# Patient Record
Sex: Female | Born: 1980 | Race: White | Hispanic: No | Marital: Married | Smoking: Former smoker
Health system: Southern US, Community
[De-identification: ages and names within clinical notes are randomized; demographics above are authoritative.]

## PROBLEM LIST (undated history)

## (undated) HISTORY — PX: CHOLECYSTECTOMY: SHX55

---

## 1998-08-29 ENCOUNTER — Other Ambulatory Visit: Admission: RE | Admit: 1998-08-29 | Discharge: 1998-08-29 | Payer: Self-pay | Admitting: *Deleted

## 1999-09-08 ENCOUNTER — Other Ambulatory Visit: Admission: RE | Admit: 1999-09-08 | Discharge: 1999-09-08 | Payer: Self-pay | Admitting: *Deleted

## 2000-10-19 ENCOUNTER — Other Ambulatory Visit: Admission: RE | Admit: 2000-10-19 | Discharge: 2000-10-19 | Payer: Self-pay | Admitting: Internal Medicine

## 2001-05-06 ENCOUNTER — Observation Stay (HOSPITAL_COMMUNITY): Admission: RE | Admit: 2001-05-06 | Discharge: 2001-05-06 | Payer: Self-pay

## 2001-05-06 ENCOUNTER — Encounter (INDEPENDENT_AMBULATORY_CARE_PROVIDER_SITE_OTHER): Payer: Self-pay | Admitting: Specialist

## 2001-05-09 ENCOUNTER — Inpatient Hospital Stay (HOSPITAL_COMMUNITY): Admission: RE | Admit: 2001-05-09 | Discharge: 2001-05-12 | Payer: Self-pay

## 2001-05-10 ENCOUNTER — Encounter: Payer: Self-pay | Admitting: Gastroenterology

## 2001-05-11 ENCOUNTER — Encounter: Payer: Self-pay | Admitting: General Surgery

## 2001-05-26 ENCOUNTER — Encounter: Payer: Self-pay | Admitting: Gastroenterology

## 2001-05-26 ENCOUNTER — Ambulatory Visit (HOSPITAL_COMMUNITY): Admission: RE | Admit: 2001-05-26 | Discharge: 2001-05-26 | Payer: Self-pay | Admitting: Gastroenterology

## 2001-06-21 ENCOUNTER — Ambulatory Visit (HOSPITAL_COMMUNITY): Admission: RE | Admit: 2001-06-21 | Discharge: 2001-06-21 | Payer: Self-pay | Admitting: Gastroenterology

## 2003-01-03 ENCOUNTER — Other Ambulatory Visit: Admission: RE | Admit: 2003-01-03 | Discharge: 2003-01-03 | Payer: Self-pay | Admitting: Internal Medicine

## 2003-11-26 ENCOUNTER — Other Ambulatory Visit: Admission: RE | Admit: 2003-11-26 | Discharge: 2003-11-26 | Payer: Self-pay | Admitting: Internal Medicine

## 2004-03-05 ENCOUNTER — Other Ambulatory Visit: Admission: RE | Admit: 2004-03-05 | Discharge: 2004-03-05 | Payer: Self-pay | Admitting: Internal Medicine

## 2005-03-09 ENCOUNTER — Other Ambulatory Visit: Admission: RE | Admit: 2005-03-09 | Discharge: 2005-03-09 | Payer: Self-pay | Admitting: Internal Medicine

## 2006-03-30 ENCOUNTER — Other Ambulatory Visit: Admission: RE | Admit: 2006-03-30 | Discharge: 2006-03-30 | Payer: Self-pay | Admitting: Internal Medicine

## 2008-04-24 ENCOUNTER — Other Ambulatory Visit: Admission: RE | Admit: 2008-04-24 | Discharge: 2008-04-24 | Payer: Self-pay | Admitting: Internal Medicine

## 2010-07-04 NOTE — Op Note (Signed)
[facility]  Patient:    Christina Greer, Christina Greer Visit Number: [id number] MRN: [id number]          Service Type: SUR Location: [id number] [phone] Attending Physician:  Meredith Leeds Dictated by:   Zigmund Daniel, M.D. Proc. Date: 05/06/01 Admit Date:  05/06/2001 Discharge Date: 05/06/2001                             Operative Report  PREOPERATIVE DIAGNOSIS:  Symptomatic gallstones.  POSTOPERATIVE DIAGNOSIS:  Symptomatic gallstones.  OPERATION:  Laparoscopic cholecystectomy.  SURGEON:  Zigmund Daniel, M.D.  ASSISTANT:  Donnie Coffin. Samuella Cota, M.D.  ANESTHESIA:  General.  DESCRIPTION OF PROCEDURE:  After the patient was monitored and anesthetized and had routine preparation and draping of the abdomen, I infused local anesthetic just below the umbilicus and made a transverse incision just below the umbilicus, identified the midline fascia, opened it longitudinally, opened the peritoneum bluntly, placed an 0 Vicryl pursestring suture in the fascia, secured a Hasson cannula and inflated the abdomen with CO2.  There were no abnormalities notable.  The gallbladder did not appear inflamed.  I then anesthetized three additional port sites and placed the ports under direct vision, placed the patient head-up, foot-down and left-tilted and retracted the fundus of the gallbladder toward the right shoulder and the infundibulum laterally.  There were no inflammatory adhesions present.  I dissected the hepatoduodenal ligament until I clearly identified the cystic duct and cystic artery.  I clipped the cystic duct near its emergence from the gallbladder with four clips and cut between the two closest to the gallbladder and similarly clipped and divided the cystic artery.  I then dissected the gallbladder from the liver using the hook type instrument for cautery and gained hemostasis as I dissected it free.  After detaching it from the liver, I removed the  gallbladder through the umbilical incision.  It was necessary to open the gallbladder and remove several large gallstones in order to pull it out the small incision.  It then came out freely.  I tied the pursestring suture and then irrigated the site.  I checked the right upper quadrant for hemostasis and saw that it was good and that the clips were secure.  I removed the lateral ports under direct vision, then allowed the CO2 to escape and removed the epigastric port.  I closed all skin incision with intracuticular 4-0 Vicryl and Steri-Strips and applied bandages.  The patient tolerated the operation well. Dictated by:   Zigmund Daniel, M.D. Attending Physician:  Meredith Leeds DD:  05/06/01 TD:  05/07/01 Job: [id number]

## 2010-07-04 NOTE — Procedures (Signed)
[facility]  Patient:    SAGAN, WURZEL Visit Number: [id number] MRN: [id number]          Service Type: END Location: ENDO Attending Physician:  Nelda Marseille Dictated by:   Petra Kuba, M.D. Proc. Date: 06/21/01 Admit Date:  06/21/2001                             Procedure Report  PROCEDURE:  Esophagogastroduodenoscopy with stent removal.  Consent was signed after risks, benefits, methods, options thoroughly discussed in the office.  MEDICATIONS:  Demerol 70, Versed 8.  DESCRIPTION OF PROCEDURE:  The side-viewing video therapeutic duodenoscope was inserted by indirect vision into the stomach, and a normal-appearing antrum and pylorus were brought into view, and the scope was easily advanced into the duodenum, and the stent in the proper position was brought into view.  Using the snare, we grabbed it in the customary fashion and withdrew it through the channel of the scope.  There was a little blood on the ampulla but no other obvious problem.  The scope was then slowly withdrawn, looking as best we could at both the bulb and the stomach.  No additional findings were seen. The scope was removed.  The patient tolerated the procedure well.  There was no obvious immediate complication.  ENDOSCOPIC DIAGNOSES: 1. Stent removed with the ERCP scope and the snare    in the customary fashion without obvious abnormality or problems. 2. No other obvious abnormalities seen on limited view using the side-viewing    scope.  PLAN: 1. Observe for delayed complications. 2. Happy to see back p.r.n. 3. Otherwise return care to Dr. Elisabeth Most for the customary health care    maintenance. Dictated by:   Petra Kuba, M.D. Attending Physician:  Nelda Marseille DD:  06/21/01 TD:  06/21/01 Job: [id number]

## 2010-07-04 NOTE — Discharge Summary (Signed)
[facility]  Patient:    Christina Greer, Christina Greer Visit Number: [id number] MRN: [id number]          Service Type: MED Location: [phone] Attending Physician:  Delsa Bern Dictated by:   Zigmund Daniel, M.D. Admit Date:  05/09/2001 Discharge Date: 05/12/2001   CC:         Florencia Reasons, M.D.   Discharge Summary  HISTORY OF PRESENT ILLNESS:  The patient is a 30 year old white female who is two days status post laparoscopic cholecystectomy which had been done on an outpatient basis and had gone very well.  On the day of admission, she got rather severe pain and was found to have slight elevation of liver enzymes and white count, and tenderness in the abdomen.  Before admission, an ultrasound was obtained showing a small amount of fluid beneath the liver, and a hepatobiliary scan was obtained showing a leak from what appeared to be the cystic duct stump.  The patient was admitted to the hospital for care.  PAST MEDICAL HISTORY:  Unremarkable, and she enjoys excellent health.  HOSPITAL COURSE:  The patient was admitted, given pain medication, cover with IV antibiotics.  The patient was seen in consultation by Dr. Matthias Hughs.  He recommended that she undergo endoscopic retrograde cholangiopancreatography to define the anatomy of the leak and probable stenting to relieve pressure in the duct.  That took place as performed by Dr. Ewing Schlein on 05/10/01.  The procedure went well.  There was found to be a leak at the cystic duct evidently at the site of the most distal clip.  There were no stones in the duct.  The patient improved, and became much more comfortable.  By 05/12/01, she was feeling quite well, eating, and requiring no significant pain medication.  Her liver tests had normalized.  She underwent another ultrasound which showed minimal fluid present.  It was not felt that this needed to be aspirated.  I let her go home, and asked her to  call me if her pain worsened or if she got a fever.  Outpatient arrangements will be made with Dr. Matthias Hughs or Dr. Ewing Schlein to have her stent removed in 6 to 8 weeks.  DIAGNOSIS:  Postoperative bile leak from the cystic duct.  PROCEDURE:  Endoscopic retrograde cholangiopancreatography with placement of stent.  CONDITION ON DISCHARGE:  Improved. Dictated by:   Zigmund Daniel, M.D. Attending Physician:  Delsa Bern DD:  05/17/01 TD:  05/17/01 Job: [id number]

## 2010-07-04 NOTE — Procedures (Signed)
[facility]  Patient:    Christina Greer, Christina Greer Visit Number: [id number] MRN: [id number]          Service Type: MED Location: [phone] Attending Physician:  Delsa Bern Dictated by:   Petra Kuba, M.D. Proc. Date: 05/10/01 Admit Date:  05/09/2001   CC:         Zigmund Daniel, M.D.  Marinus Maw, M.D.   Procedure Report  PROCEDURE:  Endoscopic retrograde cholangiopancreatography with stent.  INDICATIONS FOR PROCEDURE:  Probable bile leak.  Consent was signed after risks, benefits, methods, and options were thoroughly discussed by Dr. Matthias Hughs last night and with me prior to the procedure.  MEDICINES USED:  Demerol 100, Versed 10.  DESCRIPTION OF PROCEDURE:  The side viewing video therapeutic duodenoscope was inserted by indirect vision into the stomach. A normal appearing antrum and pylorus were brought into view and the scope was inserted into the duodenum. Normal appearing ampulla was brought into view with some bile draining. Using the triple lumen sphincterotome on first injection, a normal appearing PD was seen and this was not over filled. We went ahead and repositioned the sphincterotome and were able to get selective cannulation of the CBD to obtained deep selective cannulation. The jag wire was advanced easily into the duct and we were able to follow the wire into the CBD. The CBD was filled, the leak seemed to be coming from the cystic duct remnant and the clip area. The CBD was small probably 3-4 mm without signs of stones. The intrahepatics appeared normal. We went ahead at that juncture and removed the sphincterotome making sure to keep the jag wire deep in the intrahepatics and went ahead and placed a 10 French 7 cm plastic stent using the Oasis system in the customary fashion which was placed in the proper position above the cystic duct take off almost to the bifurcation with good drainage being seen at the  ampulla and proper position confirmed both endoscopically and fluoroscopically. At that juncture, the wire and introducer were removed and the scope was removed. The patient tolerated the procedure well. There was no obvious or immediate complication.  ENDOSCOPIC DIAGNOSIS:  1. Normal ampulla.  2. One normal PD minimal injection.  3. Normal CBD and intrahepatics.  4. Probable cystic duct leak confirmed.  5. Status post 10 French 7 cm stent placed using the Oasis system.  PLAN:  Continue antibiotics for now. Follow labs. Slowly advance diet if doing well post procedure. Otherwise consider rechecking PIPIDA scan or CT p.r.n. Will following with you. If the patient does well would planned stent removal p.r.n. or in 6-8 weeks in the usual fashion. Dictated by:   Petra Kuba, M.D. Attending Physician:  Delsa Bern DD:  05/10/01 TD:  05/11/01 Job: [phone]

## 2010-07-04 NOTE — Consult Note (Signed)
[facility]  Patient:    Christina Greer, Christina Greer Visit Number: [id number] MRN: [id number]          Service Type: MED Location: [id number] [phone] Attending Physician:  Christina Greer Dictated by:   Christina Greer, M.D. Proc. Date: 05/09/01 Admit Date:  05/09/2001   CC:         Christina Greer, M.D.   Consultation Report  REASON FOR CONSULTATION:  Christina Greer, covering for Christina Greer, asked me to see this 30 year old female for a possible biliary stenting because of a bile leak.  The patient is now two days status post an otherwise uncomplicated laparoscopic cholecystectomy (no intraoperative cholangiogram performed, per review of the operative note).  The patient began to experience pain yesterday, per conversation with Christina Greer, which intensified to the point where she re-presented for medical attention today and was found to have evidence for a bile leak based on an abdominal ultrasound which showed a fluid collection in the gallbladder fossa, and a radionuclide hepatobiliary scan which showed collection of radionuclide also in the gallbladder fossa suggesting a bile leak.  The common duct size was normal at 3 to 4 mm, and the patients preoperative liver chemistries were normal.  In view of this, endoscopic retrograde cholangiopancreatography and biliary stenting were felt to be appropriate.  ALLERGIES:  No known drug allergies.  OUTPATIENT MEDICATIONS:  Naproxen p.r.n.  PAST SURGICAL HISTORY:  Laparoscopic cholecystectomy on May 07, 2001.  PAST MEDICAL HISTORY:  None.  HABITS:  The patient smokes 1/2 pack per day for the past 5 years, and has social ethanol.  FAMILY HISTORY:  Interestingly, there is no gallbladder disease in the patients mother, but in the patients grandparents and great aunts, there is a history of gallbladder disease.  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  REVIEW OF SYSTEMS:  Not obtained.  PHYSICAL EXAMINATION:  GENERAL:  Christina Greer is drowsy, but coherent.  CHEST:  Clear.  HEART:  Normal.  ABDOMEN:  Nontender to light palpation in the left abdomen, but rather exquisitely tender to even light palpation in the epigastric area and the right side of the abdomen, without frank rigidity or overt rebound.  LABORATORY DATA:  Pending for tomorrow morning.  Preoperative liver chemistries, as noted, were normal.  IMPRESSION:  Symptomatic bile leak status post laparoscopic cholecystectomy.  PLAN:  Proceed to endoscopic retrograde cholangiopancreatography with probable biliary stenting tomorrow, as well as possible sphincterotomy and stone extraction if a stone is encountered.  The nature, purpose, and the risks of these procedures were discussed with the patient who, despite prior pain medication, seemed quite cognitive and able to comprehend them.  I also discussed the purpose and risks of the procedures with the patients mother who was at the bedside.  They are aware that there is a roughly 5% chance of pancreatitis, and I explained to the patient that such pancreatitis can occasionally be very severe and result in intensive care unit care.  Other risks discussed included cardiopulmonary problems, bleeding, perforation, and infection.  The patient is agreeable to proceed.  The procedure will be arranged for tomorrow, most likely to be done by my partner, Dr. Vida Greer, and the patient is aware of this. Dictated by:   Christina Greer, M.D. Attending Physician:  Christina Greer DD:  05/09/01 TD:  05/10/01 Job: [id number]

## 2011-05-11 ENCOUNTER — Ambulatory Visit (HOSPITAL_COMMUNITY)
Admission: RE | Admit: 2011-05-11 | Discharge: 2011-05-11 | Disposition: A | Discharge status: Home / Self Care | Payer: No Typology Code available for payment source | Source: Ambulatory Visit | Attending: Internal Medicine | Admitting: Internal Medicine

## 2011-05-11 ENCOUNTER — Other Ambulatory Visit: Payer: Self-pay | Admitting: Emergency Medicine

## 2011-05-11 ENCOUNTER — Other Ambulatory Visit (HOSPITAL_COMMUNITY)
Admission: RE | Admit: 2011-05-11 | Discharge: 2011-05-11 | Disposition: A | Discharge status: Home / Self Care | Payer: No Typology Code available for payment source | Source: Ambulatory Visit | Attending: Internal Medicine | Admitting: Internal Medicine

## 2011-05-11 ENCOUNTER — Other Ambulatory Visit (HOSPITAL_COMMUNITY): Payer: Self-pay | Admitting: Internal Medicine

## 2011-05-11 DIAGNOSIS — Z0000 Encounter for general adult medical examination without abnormal findings: Secondary | ICD-10-CM

## 2011-05-11 DIAGNOSIS — Z01419 Encounter for gynecological examination (general) (routine) without abnormal findings: Secondary | ICD-10-CM | POA: Insufficient documentation

## 2011-05-11 DIAGNOSIS — F17200 Nicotine dependence, unspecified, uncomplicated: Secondary | ICD-10-CM | POA: Insufficient documentation

## 2011-05-11 DIAGNOSIS — R05 Cough: Secondary | ICD-10-CM | POA: Insufficient documentation

## 2011-05-11 DIAGNOSIS — R059 Cough, unspecified: Secondary | ICD-10-CM | POA: Insufficient documentation

## 2013-03-01 ENCOUNTER — Ambulatory Visit: Payer: No Typology Code available for payment source | Admitting: Internal Medicine

## 2015-07-29 ENCOUNTER — Other Ambulatory Visit: Payer: Self-pay | Admitting: Gastroenterology

## 2018-05-30 ENCOUNTER — Emergency Department (HOSPITAL_BASED_OUTPATIENT_CLINIC_OR_DEPARTMENT_OTHER): Payer: Federal, State, Local not specified - PPO

## 2018-05-30 ENCOUNTER — Observation Stay (HOSPITAL_BASED_OUTPATIENT_CLINIC_OR_DEPARTMENT_OTHER)
Admission: EM | Admit: 2018-05-30 | Discharge: 2018-05-31 | Disposition: A | Discharge status: Home / Self Care | Payer: Federal, State, Local not specified - PPO | Attending: General Surgery | Admitting: General Surgery

## 2018-05-30 ENCOUNTER — Encounter (HOSPITAL_BASED_OUTPATIENT_CLINIC_OR_DEPARTMENT_OTHER): Payer: Self-pay

## 2018-05-30 ENCOUNTER — Other Ambulatory Visit: Payer: Self-pay

## 2018-05-30 DIAGNOSIS — K3580 Unspecified acute appendicitis: Secondary | ICD-10-CM | POA: Diagnosis not present

## 2018-05-30 DIAGNOSIS — Z9049 Acquired absence of other specified parts of digestive tract: Secondary | ICD-10-CM | POA: Insufficient documentation

## 2018-05-30 DIAGNOSIS — Z87891 Personal history of nicotine dependence: Secondary | ICD-10-CM | POA: Insufficient documentation

## 2018-05-30 DIAGNOSIS — R109 Unspecified abdominal pain: Secondary | ICD-10-CM | POA: Diagnosis present

## 2018-05-30 LAB — CBC WITH DIFFERENTIAL/PLATELET
Abs Immature Granulocytes: 0.04 10*3/uL (ref 0.00–0.07)
Basophils Absolute: 0 10*3/uL (ref 0.0–0.1)
Basophils Relative: 0 %
Eosinophils Absolute: 0.1 10*3/uL (ref 0.0–0.5)
Eosinophils Relative: 1 %
HCT: 40.5 % (ref 36.0–46.0)
Hemoglobin: 13.5 g/dL (ref 12.0–15.0)
Immature Granulocytes: 0 %
Lymphocytes Relative: 22 %
Lymphs Abs: 2.5 10*3/uL (ref 0.7–4.0)
MCH: 31.6 pg (ref 26.0–34.0)
MCHC: 33.3 g/dL (ref 30.0–36.0)
MCV: 94.8 fL (ref 80.0–100.0)
Monocytes Absolute: 0.9 10*3/uL (ref 0.1–1.0)
Monocytes Relative: 8 %
Neutro Abs: 7.7 10*3/uL (ref 1.7–7.7)
Neutrophils Relative %: 69 %
Platelets: 305 10*3/uL (ref 150–400)
RBC: 4.27 MIL/uL (ref 3.87–5.11)
RDW: 13.1 % (ref 11.5–15.5)
WBC: 11.3 10*3/uL — ABNORMAL HIGH (ref 4.0–10.5)
nRBC: 0 % (ref 0.0–0.2)

## 2018-05-30 LAB — URINALYSIS, ROUTINE W REFLEX MICROSCOPIC
Bilirubin Urine: NEGATIVE
Glucose, UA: NEGATIVE mg/dL
Hgb urine dipstick: NEGATIVE
Ketones, ur: NEGATIVE mg/dL
Leukocytes,Ua: NEGATIVE
Nitrite: NEGATIVE
Protein, ur: NEGATIVE mg/dL
Specific Gravity, Urine: 1.015 (ref 1.005–1.030)
pH: 7 (ref 5.0–8.0)

## 2018-05-30 LAB — BASIC METABOLIC PANEL
Anion gap: 4 — ABNORMAL LOW (ref 5–15)
BUN: 11 mg/dL (ref 6–20)
CO2: 22 mmol/L (ref 22–32)
Calcium: 8.3 mg/dL — ABNORMAL LOW (ref 8.9–10.3)
Chloride: 111 mmol/L (ref 98–111)
Creatinine, Ser: 0.73 mg/dL (ref 0.44–1.00)
GFR calc Af Amer: >60 mL/min (ref >60)
GFR calc non Af Amer: >60 mL/min (ref >60)
Glucose, Bld: 79 mg/dL (ref 70–99)
Potassium: 3.5 mmol/L (ref 3.5–5.1)
Sodium: 137 mmol/L (ref 135–145)

## 2018-05-30 LAB — PREGNANCY, URINE: Preg Test, Ur: NEGATIVE

## 2018-05-30 MED ORDER — MORPHINE SULFATE (PF) 4 MG/ML IV SOLN
4.0000 mg | Freq: Once | INTRAVENOUS | Status: AC
  Administered 2018-05-30: 4 mg via INTRAVENOUS
  Filled 2018-05-30: qty 1

## 2018-05-30 MED ORDER — ONDANSETRON HCL 4 MG/2ML IJ SOLN
4.0000 mg | Freq: Once | INTRAMUSCULAR | Status: AC
  Administered 2018-05-30: 4 mg via INTRAVENOUS
  Filled 2018-05-30: qty 2

## 2018-05-30 MED ORDER — IOHEXOL 300 MG/ML  SOLN
100.0000 mL | Freq: Once | INTRAMUSCULAR | Status: AC | PRN
  Administered 2018-05-30: 100 mL via INTRAVENOUS

## 2018-05-30 MED ORDER — SODIUM CHLORIDE 0.9 % IV SOLN
1.0000 g | Freq: Once | INTRAVENOUS | Status: AC
  Administered 2018-05-30: 1 g via INTRAVENOUS
  Filled 2018-05-30: qty 10

## 2018-05-30 MED ORDER — SODIUM CHLORIDE 0.9 % IV BOLUS
1000.0000 mL | Freq: Once | INTRAVENOUS | Status: AC
  Administered 2018-05-30: 1000 mL via INTRAVENOUS

## 2018-05-30 MED ORDER — METRONIDAZOLE IN NACL 5-0.79 MG/ML-% IV SOLN
500.0000 mg | Freq: Once | INTRAVENOUS | Status: AC
  Administered 2018-05-31: 500 mg via INTRAVENOUS
  Filled 2018-05-30: qty 100

## 2018-05-30 NOTE — ED Provider Notes (Signed)
[facility] EMERGENCY DEPARTMENT Provider Note   CSN: [id number] Arrival date & time: 05/30/18  2102    History   Chief Complaint Chief Complaint  Patient presents with  . Abdominal Pain    HPI Christina Greer is a 38 y.o. female.     Patient is a 38 year old female with past medical history of prior cholecystectomy.  She presents today for evaluation of right-sided abdominal pain.  This began earlier this afternoon and is worsening.  It began in the absence of any injury or trauma.  She denies any bowel or bladder complaints.  She denies any fevers or chills.  Her last menstrual period was last week and normal in length and duration.  She denies the possibility of pregnancy.  The history is provided by the patient.  Abdominal Pain  Pain location:  RLQ Pain quality: stabbing   Pain radiates to:  Does not radiate Pain severity:  Moderate Onset quality:  Gradual Duration:  8 hours Timing:  Constant Progression:  Worsening Chronicity:  New Relieved by:  Nothing Worsened by:  Movement, palpation and position changes   History reviewed. No pertinent past medical history.  There are no active problems to display for this patient.   Past Surgical History:  Procedure Laterality Date  . CHOLECYSTECTOMY       OB History   No obstetric history on file.      Home Medications    Prior to Admission medications   Not on File    Family History No family history on file.  Social History Social History   Tobacco Use  . Smoking status: Former Games developermoker  . Smokeless tobacco: Never Used  Substance Use Topics  . Alcohol use: Yes    Comment: occ  . Drug use: Never     Allergies   Patient has no known allergies.   Review of Systems Review of Systems  Gastrointestinal: Positive for abdominal pain.  All other systems reviewed and are negative.    Physical Exam Updated Vital Signs BP 125/89 (BP Location: Left Arm)   Pulse (!) 102   Temp 98.9 F  (37.2 C) (Oral)   Resp 18   Ht 5\' 6"  (1.676 m)   Wt 77.1 kg   LMP 05/19/2018   SpO2 98%   BMI 27.44 kg/m   Physical Exam Vitals signs and nursing note reviewed.  Constitutional:      General: She is not in acute distress.    Appearance: She is well-developed. She is not diaphoretic.  HENT:     Head: Normocephalic and atraumatic.  Neck:     Musculoskeletal: Normal range of motion and neck supple.  Cardiovascular:     Rate and Rhythm: Normal rate and regular rhythm.     Heart sounds: No murmur. No friction rub. No gallop.   Pulmonary:     Effort: Pulmonary effort is normal. No respiratory distress.     Breath sounds: Normal breath sounds. No wheezing.  Abdominal:     General: Bowel sounds are normal. There is no distension.     Palpations: Abdomen is soft.     Tenderness: There is abdominal tenderness in the right lower quadrant. There is no right CVA tenderness, guarding or rebound.  Musculoskeletal: Normal range of motion.  Skin:    General: Skin is warm and dry.  Neurological:     Mental Status: She is alert and oriented to person, place, and time.      ED Treatments / Results  Labs (all labs ordered are listed, but only abnormal results are displayed) Labs Reviewed  URINALYSIS, ROUTINE W REFLEX MICROSCOPIC - Abnormal; Notable for the following components:      Result Value   APPearance CLOUDY (*)    All other components within normal limits  PREGNANCY, URINE  BASIC METABOLIC PANEL  CBC WITH DIFFERENTIAL/PLATELET    EKG None  Radiology No results found.  Procedures Procedures (including critical care time)  Medications Ordered in ED Medications  sodium chloride 0.9 % bolus 1,000 mL (has no administration in time range)  morphine 4 MG/ML injection 4 mg (has no administration in time range)  ondansetron (ZOFRAN) injection 4 mg (has no administration in time range)     Initial Impression / Assessment and Plan / ED Course  I have reviewed the triage  vital signs and the nursing notes.  Pertinent labs & imaging results that were available during my care of the patient were reviewed by me and considered in my medical decision making (see chart for details).  CT scan shows acute appendicitis.  This was discussed with Dr. Maisie Fus from general surgery who accepts in transfer.  Patient will be transferred to [facility] for likely appendectomy in the morning.  Rocephin and Flagyl ordered.  Final Clinical Impressions(s) / ED Diagnoses   Final diagnoses:  None    ED Discharge Orders    None       Geoffery Lyons, MD 05/30/18 2329

## 2018-05-30 NOTE — ED Notes (Signed)
Patient transported to CT 

## 2018-05-30 NOTE — ED Notes (Signed)
Pt returned from CT °

## 2018-05-30 NOTE — ED Notes (Signed)
[facility] Surgery and requested a consult to general surgery

## 2018-05-30 NOTE — ED Notes (Signed)
Pt c/o worsening RLQ pain since around 1500. She last drank a little bit of water just prior to arrival, and she ate a little bit of dinner at 1800 before her e-visit with her doctor where she was told to come to the ED.

## 2018-05-30 NOTE — H&P (Signed)
Christina Greer is an 38 y.o. female.   Chief Complaint: abd pain  HPI: Patient is a 38 year old female with no significnt PMH.  She presents today for evaluation of sudden onset, worsening, non radiating, sharp, right-sided abdominal pain with associated nausea. No vomiting.    She denies any bowel or bladder complaints.  She denies any fevers or chills, CP, or SOB.  PSH pos for lap chole.  No blood thinners. CT scan showed very early appendicitis.    History reviewed. No pertinent past medical history.  Past Surgical History:  Procedure Laterality Date  . CHOLECYSTECTOMY      No family history on file. Social History:  reports that she has quit smoking. She has never used smokeless tobacco. She reports current alcohol use. She reports that she does not use drugs.  Allergies:  Allergies  Allergen Reactions  . Peanut-Containing Drug Products Anaphylaxis    (Not in a hospital admission)   Results for orders placed or performed during the hospital encounter of 05/30/18 (from the past 48 hour(s))  Urinalysis, Routine w reflex microscopic     Status: Abnormal   Collection Time: 05/30/18  9:13 PM  Result Value Ref Range   Color, Urine YELLOW YELLOW   APPearance CLOUDY (A) CLEAR   Specific Gravity, Urine 1.015 1.005 - 1.030   pH 7.0 5.0 - 8.0   Glucose, UA NEGATIVE NEGATIVE mg/dL   Hgb urine dipstick NEGATIVE NEGATIVE   Bilirubin Urine NEGATIVE NEGATIVE   Ketones, ur NEGATIVE NEGATIVE mg/dL   Protein, ur NEGATIVE NEGATIVE mg/dL   Nitrite NEGATIVE NEGATIVE   Leukocytes,Ua NEGATIVE NEGATIVE    Comment: Microscopic not done on urines with negative protein, blood, leukocytes, nitrite, or glucose < 500 mg/dL. Performed at [facility], [address] [place], [place] [address]   Pregnancy, urine     Status: None   Collection Time: 05/30/18  9:13 PM  Result Value Ref Range   Preg Test, Ur NEGATIVE NEGATIVE    Comment:        THE SENSITIVITY OF THIS METHODOLOGY IS >20  mIU/mL. Performed at [facility], [address] [place], [place] [address]   CBC with Differential     Status: Abnormal   Collection Time: 05/30/18  9:24 PM  Result Value Ref Range   WBC 11.3 (H) 4.0 - 10.5 K/uL   RBC 4.27 3.87 - 5.11 MIL/uL   Hemoglobin 13.5 12.0 - 15.0 g/dL   HCT 45.0 38.8 - 82.8 %   MCV 94.8 80.0 - 100.0 fL   MCH 31.6 26.0 - 34.0 pg   MCHC 33.3 30.0 - 36.0 g/dL   RDW 00.3 49.1 - 79.1 %   Platelets 305 150 - 400 K/uL   nRBC 0.0 0.0 - 0.2 %   Neutrophils Relative % 69 %   Neutro Abs 7.7 1.7 - 7.7 K/uL   Lymphocytes Relative 22 %   Lymphs Abs 2.5 0.7 - 4.0 K/uL   Monocytes Relative 8 %   Monocytes Absolute 0.9 0.1 - 1.0 K/uL   Eosinophils Relative 1 %   Eosinophils Absolute 0.1 0.0 - 0.5 K/uL   Basophils Relative 0 %   Basophils Absolute 0.0 0.0 - 0.1 K/uL   Immature Granulocytes 0 %   Abs Immature Granulocytes 0.04 0.00 - 0.07 K/uL    Comment: Performed at [facility], [address] [place], [place] [address]  Basic metabolic panel     Status: Abnormal  Collection Time: 05/30/18 10:03 PM  Result Value Ref Range   Sodium 137 135 - 145 mmol/L   Potassium 3.5 3.5 - 5.1 mmol/L   Chloride 111 98 - 111 mmol/L   CO2 22 22 - 32 mmol/L   Glucose, Bld 79 70 - 99 mg/dL   BUN 11 6 - 20 mg/dL   Creatinine, Ser 1.61 0.44 - 1.00 mg/dL   Calcium 8.3 (L) 8.9 - 10.3 mg/dL   GFR calc non Af Amer >60 >60 mL/min   GFR calc Af Amer >60 >60 mL/min   Anion gap 4 (L) 5 - 15    Comment: Performed at [facility], [address] [place], [place] [address]   Ct Abdomen Pelvis W Contrast  Result Date: 05/30/2018 CLINICAL DATA:  Right lower quadrant pain for several hours EXAM: CT ABDOMEN AND PELVIS WITH CONTRAST TECHNIQUE: Multidetector CT imaging of the abdomen and pelvis was performed using the standard protocol following bolus administration of intravenous contrast. CONTRAST:  OMNIPAQUE 300 COMPARISON:  None. FINDINGS: Lower chest:  No acute abnormality. Hepatobiliary: No focal liver abnormality is seen. Status post cholecystectomy. No biliary dilatation. Pancreas: Unremarkable. No pancreatic ductal dilatation or surrounding inflammatory changes. Spleen: Normal in size without focal abnormality. Adrenals/Urinary Tract: Adrenal glands are within normal limits. Kidneys are well visualized bilaterally without renal calculi or obstructive changes. The bladder is within normal limits. Stomach/Bowel: Stomach is within normal limits. No small bowel abnormality is noted. The colon is unremarkable. Appendix: Location: Infracecal Diameter: 13 mm Appendicolith: Present centrally. Multiple smaller appendicoliths are noted peripherally. Mucosal hyper-enhancement: Present Extraluminal gas: Absent Periappendiceal collection: Absent Vascular/Lymphatic: No significant vascular findings are present. No enlarged abdominal or pelvic lymph nodes. Reproductive: Uterus is within normal limits. Left ovarian cyst is noted measuring 2 cm in greatest dimension. Other: Minimal free pelvic fluid is noted likely reactive in nature. Musculoskeletal: No acute or significant osseous findings. IMPRESSION: Changes consistent with very early appendicitis as described above. No other focal abnormality is seen. Electronically Signed   By: Alcide Clever M.D.   On: 05/30/2018 23:15    Review of Systems  Constitutional: Negative for chills and fever.  HENT: Negative for hearing loss and sore throat.   Eyes: Negative for blurred vision and double vision.  Respiratory: Negative for cough and shortness of breath.   Cardiovascular: Negative for chest pain and palpitations.  Gastrointestinal: Positive for abdominal pain and nausea. Negative for vomiting.  Genitourinary: Negative for dysuria, frequency and urgency.  Skin: Negative for itching and rash.  Neurological: Negative for dizziness and headaches.    Blood pressure 125/89, pulse (!) 102, temperature 98.9 F (37.2 C),  temperature source Oral, resp. rate 18, height  (1.676 m), weight 77.1 kg, last menstrual period 05/19/2018, SpO2 98 %. Physical Exam  Constitutional: She is oriented to person, place, and time. She appears well-developed and well-nourished. No distress.  HENT:  Head: Normocephalic and atraumatic.  Nose: Nose normal.  Eyes: Pupils are equal, round, and reactive to light. Conjunctivae and lids are normal.  Neck: Normal range of motion. Neck supple.  Cardiovascular: Normal rate, regular rhythm, S1 normal, S2 normal and normal heart sounds.  Pulses:      Radial pulses are 2+ on the right side and 2+ on the left side.  Respiratory: Effort normal. No tachypnea. No respiratory distress. She has no decreased breath sounds. She has no wheezes. She has no rhonchi. She has no rales.  GI: Soft. Normal appearance.  She exhibits no distension. There is no hepatosplenomegaly. There is abdominal tenderness (RLQ) in the right lower quadrant. There is tenderness at McBurney's point.  Musculoskeletal: Normal range of motion.        General: No tenderness, deformity or edema.  Neurological: She is alert and oriented to person, place, and time.  Skin: Skin is warm and dry. She is not diaphoretic.  Psychiatric: She has a normal mood and affect. Her behavior is normal.     Assessment/Plan Acute appendicitis - admit to [facility] - OR today for lap appy, IV abx - hopefully discharge this evening  Mattie MarlinJessica Sadhana Frater, [facility] Pager [phone]

## 2018-05-30 NOTE — ED Triage Notes (Signed)
C/o RLQ pain, nausea since 3pm-NAD-steady gait

## 2018-05-31 ENCOUNTER — Observation Stay (HOSPITAL_COMMUNITY): Payer: Federal, State, Local not specified - PPO | Admitting: Registered Nurse

## 2018-05-31 ENCOUNTER — Encounter (HOSPITAL_COMMUNITY)
Admission: EM | Disposition: A | Discharge status: Home / Self Care | Payer: Self-pay | Source: Home / Self Care | Attending: Emergency Medicine

## 2018-05-31 DIAGNOSIS — K3580 Unspecified acute appendicitis: Secondary | ICD-10-CM | POA: Diagnosis not present

## 2018-05-31 DIAGNOSIS — Z87891 Personal history of nicotine dependence: Secondary | ICD-10-CM | POA: Diagnosis not present

## 2018-05-31 DIAGNOSIS — Z9049 Acquired absence of other specified parts of digestive tract: Secondary | ICD-10-CM | POA: Diagnosis not present

## 2018-05-31 DIAGNOSIS — R109 Unspecified abdominal pain: Secondary | ICD-10-CM | POA: Diagnosis present

## 2018-05-31 HISTORY — PX: LAPAROSCOPIC APPENDECTOMY: SHX408

## 2018-05-31 LAB — SURGICAL PCR SCREEN
MRSA, PCR: NEGATIVE
Staphylococcus aureus: NEGATIVE

## 2018-05-31 LAB — CBC
HCT: 37.7 % (ref 36.0–46.0)
Hemoglobin: 12.5 g/dL (ref 12.0–15.0)
MCH: 31.1 pg (ref 26.0–34.0)
MCHC: 33.2 g/dL (ref 30.0–36.0)
MCV: 93.8 fL (ref 80.0–100.0)
Platelets: 282 10*3/uL (ref 150–400)
RBC: 4.02 MIL/uL (ref 3.87–5.11)
RDW: 13 % (ref 11.5–15.5)
WBC: 10.7 10*3/uL — ABNORMAL HIGH (ref 4.0–10.5)
nRBC: 0 % (ref 0.0–0.2)

## 2018-05-31 LAB — HIV ANTIBODY (ROUTINE TESTING W REFLEX): HIV Screen 4th Generation wRfx: NONREACTIVE

## 2018-05-31 SURGERY — APPENDECTOMY, LAPAROSCOPIC
Anesthesia: General | Site: Abdomen

## 2018-05-31 MED ORDER — ACETAMINOPHEN 500 MG PO TABS
1000.0000 mg | ORAL_TABLET | ORAL | Status: AC
  Administered 2018-05-31: 1000 mg via ORAL
  Filled 2018-05-31: qty 2

## 2018-05-31 MED ORDER — ROCURONIUM BROMIDE 50 MG/5ML IV SOSY
PREFILLED_SYRINGE | INTRAVENOUS | Status: AC
  Filled 2018-05-31: qty 5

## 2018-05-31 MED ORDER — ONDANSETRON HCL 4 MG/2ML IJ SOLN: 4.0000 mg | Freq: Four times a day (QID) | INTRAMUSCULAR | Status: DC | PRN

## 2018-05-31 MED ORDER — PHENYLEPHRINE 40 MCG/ML (10ML) SYRINGE FOR IV PUSH (FOR BLOOD PRESSURE SUPPORT)
PREFILLED_SYRINGE | INTRAVENOUS | Status: DC | PRN
  Administered 2018-05-31 (×2): 80 ug via INTRAVENOUS

## 2018-05-31 MED ORDER — MIDAZOLAM HCL 5 MG/5ML IJ SOLN
INTRAMUSCULAR | Status: DC | PRN
  Administered 2018-05-31: 2 mg via INTRAVENOUS

## 2018-05-31 MED ORDER — DEXAMETHASONE SODIUM PHOSPHATE 10 MG/ML IJ SOLN
INTRAMUSCULAR | Status: AC
  Filled 2018-05-31: qty 1

## 2018-05-31 MED ORDER — PROPOFOL 10 MG/ML IV BOLUS
INTRAVENOUS | Status: DC | PRN
  Administered 2018-05-31: 130 mg via INTRAVENOUS

## 2018-05-31 MED ORDER — ONDANSETRON HCL 4 MG/2ML IJ SOLN
INTRAMUSCULAR | Status: AC
  Filled 2018-05-31: qty 2

## 2018-05-31 MED ORDER — ROCURONIUM BROMIDE 50 MG/5ML IV SOSY
PREFILLED_SYRINGE | INTRAVENOUS | Status: DC | PRN
  Administered 2018-05-31: 40 mg via INTRAVENOUS

## 2018-05-31 MED ORDER — BUPIVACAINE HCL (PF) 0.25 % IJ SOLN
INTRAMUSCULAR | Status: AC
  Filled 2018-05-31: qty 30

## 2018-05-31 MED ORDER — ENOXAPARIN SODIUM 40 MG/0.4ML ~~LOC~~ SOLN: 40.0000 mg | SUBCUTANEOUS | Status: DC

## 2018-05-31 MED ORDER — DIPHENHYDRAMINE HCL 50 MG/ML IJ SOLN
INTRAMUSCULAR | Status: AC
  Filled 2018-05-31: qty 1

## 2018-05-31 MED ORDER — DIPHENHYDRAMINE HCL 25 MG PO CAPS: 25.0000 mg | ORAL_CAPSULE | Freq: Four times a day (QID) | ORAL | Status: DC | PRN

## 2018-05-31 MED ORDER — HYDROMORPHONE HCL 1 MG/ML IJ SOLN
0.5000 mg | INTRAMUSCULAR | Status: DC | PRN
  Administered 2018-05-31 (×2): 0.5 mg via INTRAVENOUS
  Filled 2018-05-31 (×2): qty 1

## 2018-05-31 MED ORDER — ONDANSETRON 4 MG PO TBDP: 4.0000 mg | ORAL_TABLET | Freq: Four times a day (QID) | ORAL | Status: DC | PRN

## 2018-05-31 MED ORDER — SUGAMMADEX SODIUM 200 MG/2ML IV SOLN
INTRAVENOUS | Status: DC | PRN
  Administered 2018-05-31 (×2): 100 mg via INTRAVENOUS

## 2018-05-31 MED ORDER — SUCCINYLCHOLINE CHLORIDE 200 MG/10ML IV SOSY
PREFILLED_SYRINGE | INTRAVENOUS | Status: AC
  Filled 2018-05-31: qty 10

## 2018-05-31 MED ORDER — OXYCODONE HCL 5 MG PO TABS
5.0000 mg | ORAL_TABLET | ORAL | Status: DC | PRN
  Administered 2018-05-31: 10 mg via ORAL
  Filled 2018-05-31: qty 2

## 2018-05-31 MED ORDER — ONDANSETRON HCL 4 MG/2ML IJ SOLN
INTRAMUSCULAR | Status: DC | PRN
  Administered 2018-05-31: 4 mg via INTRAVENOUS

## 2018-05-31 MED ORDER — DIPHENHYDRAMINE HCL 50 MG/ML IJ SOLN: 25.0000 mg | Freq: Four times a day (QID) | INTRAMUSCULAR | Status: DC | PRN

## 2018-05-31 MED ORDER — 0.9 % SODIUM CHLORIDE (POUR BTL) OPTIME
TOPICAL | Status: DC | PRN
  Administered 2018-05-31: 10:00:00 1000 mL

## 2018-05-31 MED ORDER — HYDROMORPHONE HCL 1 MG/ML IJ SOLN
0.2500 mg | INTRAMUSCULAR | Status: DC | PRN
  Administered 2018-05-31 (×2): 0.5 mg via INTRAVENOUS

## 2018-05-31 MED ORDER — TRAMADOL HCL 50 MG PO TABS: 50.0000 mg | ORAL_TABLET | Freq: Four times a day (QID) | ORAL | Status: DC | PRN

## 2018-05-31 MED ORDER — MIDAZOLAM HCL 2 MG/2ML IJ SOLN
INTRAMUSCULAR | Status: AC
  Filled 2018-05-31: qty 2

## 2018-05-31 MED ORDER — SCOPOLAMINE 1 MG/3DAYS TD PT72
1.0000 | MEDICATED_PATCH | TRANSDERMAL | Status: DC
  Administered 2018-05-31: 1.5 mg via TRANSDERMAL
  Filled 2018-05-31: qty 1

## 2018-05-31 MED ORDER — LIDOCAINE 2% (20 MG/ML) 5 ML SYRINGE
INTRAMUSCULAR | Status: AC
  Filled 2018-05-31: qty 5

## 2018-05-31 MED ORDER — PROPOFOL 10 MG/ML IV BOLUS
INTRAVENOUS | Status: AC
  Filled 2018-05-31: qty 20

## 2018-05-31 MED ORDER — LIDOCAINE 2% (20 MG/ML) 5 ML SYRINGE
INTRAMUSCULAR | Status: DC | PRN
  Administered 2018-05-31: 60 mg via INTRAVENOUS

## 2018-05-31 MED ORDER — SODIUM CHLORIDE 0.9 % IV SOLN
INTRAVENOUS | Status: DC | PRN
  Administered 2018-05-31: 10:00:00 45 ug/min via INTRAVENOUS

## 2018-05-31 MED ORDER — DIPHENHYDRAMINE HCL 50 MG/ML IJ SOLN
INTRAMUSCULAR | Status: DC | PRN
  Administered 2018-05-31: 12.5 mg via INTRAVENOUS

## 2018-05-31 MED ORDER — SODIUM CHLORIDE 0.9 % IV SOLN
2.0000 g | INTRAVENOUS | Status: DC
  Filled 2018-05-31: qty 20

## 2018-05-31 MED ORDER — OXYCODONE HCL 5 MG PO TABS: 5.0000 mg | ORAL_TABLET | Freq: Four times a day (QID) | ORAL | 0 refills | Status: AC | PRN

## 2018-05-31 MED ORDER — DEXAMETHASONE SODIUM PHOSPHATE 10 MG/ML IJ SOLN
INTRAMUSCULAR | Status: DC | PRN
  Administered 2018-05-31: 5 mg via INTRAVENOUS

## 2018-05-31 MED ORDER — LACTATED RINGERS IV SOLN
INTRAVENOUS | Status: DC
  Administered 2018-05-31 (×2): via INTRAVENOUS

## 2018-05-31 MED ORDER — POTASSIUM CHLORIDE IN NACL 20-0.45 MEQ/L-% IV SOLN
INTRAVENOUS | Status: DC
  Administered 2018-05-31 (×2): via INTRAVENOUS
  Filled 2018-05-31 (×2): qty 1000

## 2018-05-31 MED ORDER — HYDROMORPHONE HCL 1 MG/ML IJ SOLN
INTRAMUSCULAR | Status: AC
  Filled 2018-05-31: qty 1

## 2018-05-31 MED ORDER — FENTANYL CITRATE (PF) 250 MCG/5ML IJ SOLN
INTRAMUSCULAR | Status: AC
  Filled 2018-05-31: qty 5

## 2018-05-31 MED ORDER — SUCCINYLCHOLINE CHLORIDE 20 MG/ML IJ SOLN
INTRAMUSCULAR | Status: DC | PRN
  Administered 2018-05-31: 100 mg via INTRAVENOUS

## 2018-05-31 MED ORDER — METRONIDAZOLE IN NACL 5-0.79 MG/ML-% IV SOLN
500.0000 mg | Freq: Three times a day (TID) | INTRAVENOUS | Status: DC
  Administered 2018-05-31: 500 mg via INTRAVENOUS
  Filled 2018-05-31: qty 100

## 2018-05-31 MED ORDER — DEXTROSE-NACL 5-0.9 % IV SOLN
INTRAVENOUS | Status: DC
  Administered 2018-05-31: 13:00:00 via INTRAVENOUS

## 2018-05-31 MED ORDER — BUPIVACAINE HCL 0.25 % IJ SOLN
INTRAMUSCULAR | Status: DC | PRN
  Administered 2018-05-31: 10 mL

## 2018-05-31 MED ORDER — SODIUM CHLORIDE 0.9 % IR SOLN
Status: DC | PRN
  Administered 2018-05-31: 1

## 2018-05-31 MED ORDER — GABAPENTIN 300 MG PO CAPS
300.0000 mg | ORAL_CAPSULE | ORAL | Status: AC
  Administered 2018-05-31: 300 mg via ORAL
  Filled 2018-05-31: qty 1

## 2018-05-31 MED ORDER — FENTANYL CITRATE (PF) 100 MCG/2ML IJ SOLN
INTRAMUSCULAR | Status: DC | PRN
  Administered 2018-05-31: 100 ug via INTRAVENOUS

## 2018-05-31 SURGICAL SUPPLY — 45 items
APL SKNCLS STERI-STRIP NONHPOA (GAUZE/BANDAGES/DRESSINGS) ×1
APPLIER CLIP 5 13 M/L LIGAMAX5 (MISCELLANEOUS)
APR CLP MED LRG 5 ANG JAW (MISCELLANEOUS)
BENZOIN TINCTURE PRP APPL 2/3 (GAUZE/BANDAGES/DRESSINGS) ×3 IMPLANT
BLADE CLIPPER SURG (BLADE) IMPLANT
CANISTER SUCT 3000ML PPV (MISCELLANEOUS) ×3 IMPLANT
CHLORAPREP W/TINT 26ML (MISCELLANEOUS) ×3 IMPLANT
CLIP APPLIE 5 13 M/L LIGAMAX5 (MISCELLANEOUS) IMPLANT
CLIP VESOLOCK XL 6/CT (CLIP) ×3 IMPLANT
CLOSURE WOUND 1/2 X4 (GAUZE/BANDAGES/DRESSINGS) ×1
COVER SURGICAL LIGHT HANDLE (MISCELLANEOUS) ×3 IMPLANT
COVER TRANSDUCER ULTRASND (DRAPES) ×3 IMPLANT
COVER WAND RF STERILE (DRAPES) ×3 IMPLANT
ELECT REM PT RETURN 9FT ADLT (ELECTROSURGICAL) ×3
ELECTRODE REM PT RTRN 9FT ADLT (ELECTROSURGICAL) ×1 IMPLANT
ENDOLOOP SUT PDS II  0 18 (SUTURE) ×6
ENDOLOOP SUT PDS II 0 18 (SUTURE) IMPLANT
GAUZE SPONGE 2X2 8PLY STRL LF (GAUZE/BANDAGES/DRESSINGS) ×1 IMPLANT
GLOVE BIO SURGEON STRL SZ7.5 (GLOVE) ×3 IMPLANT
GOWN STRL REUS W/ TWL LRG LVL3 (GOWN DISPOSABLE) ×2 IMPLANT
GOWN STRL REUS W/ TWL XL LVL3 (GOWN DISPOSABLE) ×1 IMPLANT
GOWN STRL REUS W/TWL LRG LVL3 (GOWN DISPOSABLE) ×6
GOWN STRL REUS W/TWL XL LVL3 (GOWN DISPOSABLE) ×3
GRASPER SUT TROCAR 14GX15 (MISCELLANEOUS) ×3 IMPLANT
KIT BASIN OR (CUSTOM PROCEDURE TRAY) ×3 IMPLANT
KIT TURNOVER KIT B (KITS) ×3 IMPLANT
NDL INSUFFLATION 14GA 120MM (NEEDLE) ×1 IMPLANT
NEEDLE INSUFFLATION 14GA 120MM (NEEDLE) ×3 IMPLANT
NS IRRIG 1000ML POUR BTL (IV SOLUTION) ×3 IMPLANT
PAD ARMBOARD 7.5X6 YLW CONV (MISCELLANEOUS) ×6 IMPLANT
SCISSORS LAP 5X35 DISP (ENDOMECHANICALS) ×3 IMPLANT
SET IRRIG TUBING LAPAROSCOPIC (IRRIGATION / IRRIGATOR) ×3 IMPLANT
SET TUBE SMOKE EVAC HIGH FLOW (TUBING) ×3 IMPLANT
SLEEVE ENDOPATH XCEL 5M (ENDOMECHANICALS) ×3 IMPLANT
SPECIMEN JAR SMALL (MISCELLANEOUS) ×3 IMPLANT
SPONGE GAUZE 2X2 STER 10/PKG (GAUZE/BANDAGES/DRESSINGS) ×2
STRIP CLOSURE SKIN 1/2X4 (GAUZE/BANDAGES/DRESSINGS) ×2 IMPLANT
SUT MNCRL AB 4-0 PS2 18 (SUTURE) ×3 IMPLANT
TOWEL OR 17X24 6PK STRL BLUE (TOWEL DISPOSABLE) ×3 IMPLANT
TOWEL OR 17X26 10 PK STRL BLUE (TOWEL DISPOSABLE) ×3 IMPLANT
TRAY FOLEY CATH SILVER 16FR (SET/KITS/TRAYS/PACK) ×3 IMPLANT
TRAY LAPAROSCOPIC MC (CUSTOM PROCEDURE TRAY) ×3 IMPLANT
TROCAR XCEL NON-BLD 11X100MML (ENDOMECHANICALS) ×3 IMPLANT
TROCAR XCEL NON-BLD 5MMX100MML (ENDOMECHANICALS) ×3 IMPLANT
WATER STERILE IRR 1000ML POUR (IV SOLUTION) ×3 IMPLANT

## 2018-05-31 NOTE — ED Notes (Addendum)
ED TO INPATIENT HANDOFF REPORT  ED Nurse Name and Phone #: Maralyn Sago [phone]  S Name/Age/Gender Christina Greer 38 y.o. female Room/Bed: MH01/MH01  Code Status   Code Status: Not on file  Home/SNF/Other  Patient oriented to: self, place, time and situation Is this baseline? yes  Triage Complete: Triage complete  Chief Complaint right side/hip pain  Triage Note C/o RLQ pain, nausea since 3pm-NAD-steady gait   Allergies Allergies  Allergen Reactions  . Peanut-Containing Drug Products Anaphylaxis    Level of Care/Admitting Diagnosis ED Disposition    ED Disposition Condition Comment   Admit  Hospital Area: [facility] [[id number]]  Level of Care: Med-Surg [16]  Diagnosis: Acute appendicitis [034742]  Admitting Physician: Romie Levee [[id number]]  Attending Physician: Romie Levee [phone]  Estimated length of stay: past midnight tomorrow  Certification:: I certify this patient will need inpatient services for at least 2 midnights  PT Class (Do Not Modify): Inpatient [101]  PT Acc Code (Do Not Modify): Private [1]       B Medical/Surgery History History reviewed. No pertinent past medical history. Past Surgical History:  Procedure Laterality Date  . CHOLECYSTECTOMY       A IV Location/Drains/Wounds Patient Lines/Drains/Airways Status   Active Line/Drains/Airways    Name:   Placement date:   Placement time:   Site:   Days:   Peripheral IV 05/30/18 Right Antecubital   05/30/18    2139    Antecubital   1          Intake/Output Last 24 hours  Intake/Output Summary (Last 24 hours) at 05/31/2018 0031 Last data filed at 05/31/2018 0005 Gross per 24 hour  Intake 1100 ml  Output -  Net 1100 ml    Labs/Imaging Results for orders placed or performed during the hospital encounter of 05/30/18 (from the past 48 hour(s))  Urinalysis, Routine w reflex microscopic     Status: Abnormal   Collection Time: 05/30/18  9:13 PM  Result Value Ref Range    Color, Urine YELLOW YELLOW   APPearance CLOUDY (A) CLEAR   Specific Gravity, Urine 1.015 1.005 - 1.030   pH 7.0 5.0 - 8.0   Glucose, UA NEGATIVE NEGATIVE mg/dL   Hgb urine dipstick NEGATIVE NEGATIVE   Bilirubin Urine NEGATIVE NEGATIVE   Ketones, ur NEGATIVE NEGATIVE mg/dL   Protein, ur NEGATIVE NEGATIVE mg/dL   Nitrite NEGATIVE NEGATIVE   Leukocytes,Ua NEGATIVE NEGATIVE    Comment: Microscopic not done on urines with negative protein, blood, leukocytes, nitrite, or glucose < 500 mg/dL. Performed at [facility], [address] [place], [place] [address]   Pregnancy, urine     Status: None   Collection Time: 05/30/18  9:13 PM  Result Value Ref Range   Preg Test, Ur NEGATIVE NEGATIVE    Comment:        THE SENSITIVITY OF THIS METHODOLOGY IS >20 mIU/mL. Performed at [facility], [address] [place], [place] [address]   CBC with Differential     Status: Abnormal   Collection Time: 05/30/18  9:24 PM  Result Value Ref Range   WBC 11.3 (H) 4.0 - 10.5 K/uL   RBC 4.27 3.87 - 5.11 MIL/uL   Hemoglobin 13.5 12.0 - 15.0 g/dL   HCT 51.8 84.1 - 66.0 %   MCV 94.8 80.0 - 100.0 fL   MCH 31.6 26.0 - 34.0 pg   MCHC 33.3 30.0 - 36.0 g/dL   RDW 63.0 16.0 -  15.5 %   Platelets 305 150 - 400 K/uL   nRBC 0.0 0.0 - 0.2 %   Neutrophils Relative % 69 %   Neutro Abs 7.7 1.7 - 7.7 K/uL   Lymphocytes Relative 22 %   Lymphs Abs 2.5 0.7 - 4.0 K/uL   Monocytes Relative 8 %   Monocytes Absolute 0.9 0.1 - 1.0 K/uL   Eosinophils Relative 1 %   Eosinophils Absolute 0.1 0.0 - 0.5 K/uL   Basophils Relative 0 %   Basophils Absolute 0.0 0.0 - 0.1 K/uL   Immature Granulocytes 0 %   Abs Immature Granulocytes 0.04 0.00 - 0.07 K/uL    Comment: Performed at [facility], [address] [place], [place] [address]  Basic metabolic panel     [facility]   Collection Time: 05/30/18 10:03 PM  Result Value Ref Range   Sodium 137 135 - 145 mmol/L   Potassium 3.5 3.5 - 5.1  mmol/L   Chloride 111 98 - 111 mmol/L   CO2 22 22 - 32 mmol/L   Glucose, Bld 79 70 - 99 mg/dL   BUN 11 6 - 20 mg/dL   Creatinine, Ser 6.040.73 0.44 - 1.00 mg/dL   Calcium 8.3 (L) 8.9 - 10.3 mg/dL   GFR calc non Af Amer >60 >60 mL/min   GFR calc Af Amer >60 >60 mL/min   Anion gap 4 (L) 5 - 15    Comment: Performed at [facility], [address] [place], [place] [address]   Ct Abdomen Pelvis W Contrast  Result Date: 05/30/2018 CLINICAL DATA:  Right lower quadrant pain for several hours EXAM: CT ABDOMEN AND PELVIS WITH CONTRAST TECHNIQUE: Multidetector CT imaging of the abdomen and pelvis was performed using the standard protocol following bolus administration of intravenous contrast. CONTRAST:  100mL OMNIPAQUE 300 COMPARISON:  None. FINDINGS: Lower chest: No acute abnormality. Hepatobiliary: No focal liver abnormality is seen. Status post cholecystectomy. No biliary dilatation. Pancreas: Unremarkable. No pancreatic ductal dilatation or surrounding inflammatory changes. Spleen: Normal in size without focal abnormality. Adrenals/Urinary Tract: Adrenal glands are within normal limits. Kidneys are well visualized bilaterally without renal calculi or obstructive changes. The bladder is within normal limits. Stomach/Bowel: Stomach is within normal limits. No small bowel abnormality is noted. The colon is unremarkable. Appendix: Location: Infracecal Diameter: 13 mm Appendicolith: Present centrally. Multiple smaller appendicoliths are noted peripherally. Mucosal hyper-enhancement: Present Extraluminal gas: Absent Periappendiceal collection: Absent Vascular/Lymphatic: No significant vascular findings are present. No enlarged abdominal or pelvic lymph nodes. Reproductive: Uterus is within normal limits. Left ovarian cyst is noted measuring 2 cm in greatest dimension. Other: Minimal free pelvic fluid is noted likely reactive in nature. Musculoskeletal: No acute or significant osseous findings. IMPRESSION:  Changes consistent with very early appendicitis as described above. No other focal abnormality is seen. Electronically Signed   By: Alcide CleverMark  Lukens M.D.   On: 05/30/2018 23:15    Pending Labs [employer]resulted Labs (From admission, onward)    Start     Ordered   Signed and Held  HIV antibody (Routine Testing)  Once,   R     Signed and Held   Signed and Held  CBC  Tomorrow morning,   R     Signed and Held          Vitals/Pain Today's Vitals   05/30/18 2108 05/30/18 2117 05/31/18 0005 05/31/18 0011  BP: 125/89  116/72   Pulse: (!) 102  99   Resp:  18  18   Temp: 98.9 F (37.2 C)   98.7 F (37.1 C)  TempSrc: Oral   Oral  SpO2: 98%  99%   Weight:  77.1 kg    Height:  5\' 6"  (1.676 m)    PainSc:        Isolation Precautions No active isolations  Medications Medications  metroNIDAZOLE (FLAGYL) IVPB 500 mg (500 mg Intravenous New Bag/Given 05/31/18 0010)  sodium chloride 0.9 % bolus 1,000 mL (0 mLs Intravenous Stopped 05/30/18 2243)  morphine 4 MG/ML injection 4 mg (4 mg Intravenous Given 05/30/18 2142)  ondansetron (ZOFRAN) injection 4 mg (4 mg Intravenous Given 05/30/18 2142)  iohexol (OMNIPAQUE) 300 MG/ML solution 100 mL (100 mLs Intravenous Contrast Given 05/30/18 2244)  morphine 4 MG/ML injection 4 mg (4 mg Intravenous Given 05/30/18 2256)  cefTRIAXone (ROCEPHIN) 1 g in sodium chloride 0.9 % 100 mL IVPB (0 g Intravenous Stopped 05/31/18 0005)    Mobility walks Low fall risk   Focused Assessments GI assessment   R Recommendations: See Admitting Provider Note  Report given to:   Additional Notes:

## 2018-05-31 NOTE — Op Note (Signed)
05/31/2018  10:35 AM  PATIENT:  Christina Greer  38 y.o. female  PRE-OPERATIVE DIAGNOSIS:  Acute appendicitis  POST-OPERATIVE DIAGNOSIS:  Acute appendicitis  PROCEDURE:  Procedure(s): APPENDECTOMY LAPAROSCOPIC (N/A)  SURGEON:  Surgeon(s) and Role:    Axel Filler, MD - Primary  ANESTHESIA:   local and general  EBL:  10 mL   BLOOD ADMINISTERED:none  DRAINS: none   LOCAL MEDICATIONS USED:  BUPIVICAINE   SPECIMEN:  Source of Specimen:  appendix  DISPOSITION OF SPECIMEN:  PATHOLOGY  COUNTS:  YES  TOURNIQUET:  * No tourniquets in log *  DICTATION: .Dragon Dictation  Complications: none  Counts: reported as correct x 2  Findings:  The patient had a acutely inflammed appendix  Specimen: Appendix  Indications for procedure:  The patient is a 38 year old female with a history of periumbilical pain localized in the right lower quadrant patient had a CT scan which revealed signs consistent with acute appendicitis the patient back in for laparoscopic appendectomy.  Details of the procedure:The patient was taken back to the operating room. The patient was placed in supine position with bilateral SCDs in place.  A foley catheter was place. The patient was prepped and draped in the usual sterile fashion.  After appropriate anitbiotics were confirmed, a time-out was confirmed and all facts were verified.    A pneumoperitoneum of 14 mmHg was obtained via a Veress needle technique in the left lower quadrant quadrant.  A 5 mm trocar and 5 mm camera then placed intra-abdominally there is no injury to any intra-abdominal organs a 10 mm infraumbilical port was placed and direct visualization as was a 5 mm port in the suprapubic area.   The appendix was identified and seen to be non-perforated.  The appendix was cleaned down to the appendiceal base. The mesoappendix was then incised and the appendiceal artery was cauterized.  The the appendiceal base was clean.  At this time an Endoloop  was placed proximallyx2 and one distally and the appendix was transected between these 2. A retrieval bag was then placed into the abdomen and the specimen placed in the bag. The appendiceal stump was cauterized. We evacuate the fluid from the pelvis until the effluent was clear.  The appendix and retrieval  bag was then retrieved via the supraumbilical port. #1 Vicryl was used to reapproximate the fascia at the umbilical port site x1. The skin was reapproximated all port sites 3-0 Monocryl subcuticular fashion. The skin was dressed with Dermabond.  The patient had the foley removed. The patient was awakened from general anesthesia was taken to recovery room in stable condition.       PLAN OF CARE: Obs  PATIENT DISPOSITION:  PACU - hemodynamically stable.   Delay start of Pharmacological VTE agent (>24hrs) due to surgical blood loss or risk of bleeding: not applicable

## 2018-05-31 NOTE — Anesthesia Preprocedure Evaluation (Addendum)
Anesthesia Evaluation  Patient identified by MRN, date of birth, ID band Patient awake    Reviewed: Allergy & Precautions, H&P , NPO status , Patient's Chart, lab work & pertinent test results  Airway Mallampati: II  TM Distance: >3 FB Neck ROM: Full    Dental no notable dental hx. (+) Teeth Intact, Dental Advisory Given   Pulmonary neg pulmonary ROS, former smoker,    Pulmonary exam normal breath sounds clear to auscultation       Cardiovascular negative cardio ROS   Rhythm:Regular Rate:Normal     Neuro/Psych negative neurological ROS  negative psych ROS   GI/Hepatic negative GI ROS, Neg liver ROS,   Endo/Other  negative endocrine ROS  Renal/GU negative Renal ROS  negative genitourinary   Musculoskeletal   Abdominal   Peds  Hematology negative hematology ROS (+)   Anesthesia Other Findings   Reproductive/Obstetrics negative OB ROS                            Anesthesia Physical Anesthesia Plan  ASA: I  Anesthesia Plan: General   Post-op Pain Management:    Induction: Intravenous, Rapid sequence and Cricoid pressure planned  PONV Risk Score and Plan: 4 or greater and Ondansetron, Dexamethasone and Midazolam  Airway Management Planned: Oral ETT  Additional Equipment:   Intra-op Plan:   Post-operative Plan: Extubation in OR  Informed Consent: I have reviewed the patients History and Physical, chart, labs and discussed the procedure including the risks, benefits and alternatives for the proposed anesthesia with the patient or authorized representative who has indicated his/her understanding and acceptance.     Dental advisory given  Plan Discussed with: CRNA  Anesthesia Plan Comments:         Anesthesia Quick Evaluation

## 2018-05-31 NOTE — Discharge Instructions (Addendum)
[facility] ° °LAPAROSCOPIC SURGERY: POST OP INSTRUCTIONS °Always review your discharge instruction sheet given to you by the facility where your surgery was performed. °IF YOU HAVE DISABILITY OR FAMILY LEAVE FORMS, YOU MUST BRING THEM TO THE OFFICE FOR PROCESSING.   °DO NOT GIVE THEM TO YOUR DOCTOR. ° °PAIN CONTROL ° °1. First take acetaminophen (Tylenol) AND/or ibuprofen (Advil) to control your pain after surgery.  Follow directions on package.  Taking acetaminophen (Tylenol) and/or ibuprofen (Advil) regularly after surgery will help to control your pain and lower the amount of prescription pain medication you may need.  You should not take more than 4,000 mg (4 grams) of acetaminophen (Tylenol) in 24 hours.  You should not take ibuprofen (Advil), aleve, motrin, naprosyn or other NSAIDS if you have a history of stomach ulcers or chronic kidney disease.  °2. A prescription for pain medication may be given to you upon discharge.  Take your pain medication as prescribed, if you still have uncontrolled pain after taking acetaminophen (Tylenol) or ibuprofen (Advil). °3. Use ice packs to help control pain. °4. If you need a refill on your pain medication, please contact your pharmacy.  They will contact our office to request authorization. Prescriptions will not be filled after 5pm or on week-ends. ° °HOME MEDICATIONS °5. Take your usually prescribed medications unless otherwise directed. ° °DIET °6. You should follow a light diet the first few days after arrival home.  Be sure to include lots of fluids daily. Avoid fatty, fried foods.  ° °CONSTIPATION °7. It is common to experience some constipation after surgery and if you are taking pain medication.  Increasing fluid intake and taking a stool softener (such as Colace) will usually help or prevent this problem from occurring.  A mild laxative (Milk of Magnesia or Miralax) should be taken according to package instructions if there are no bowel  movements after 48 hours. ° °WOUND/INCISION CARE °8. Most patients will experience some swelling and bruising in the area of the incisions.  Ice packs will help.  Swelling and bruising can take several days to resolve.  °9. Unless discharge instructions indicate otherwise, follow guidelines below  °a. STERI-STRIPS - you may remove your outer bandages 48 hours after surgery, and you may shower at that time.  You have steri-strips (small skin tapes) in place directly over the incision.  These strips should be left on the skin for 7-10 days.   °b. DERMABOND/SKIN GLUE - you may shower in 24 hours.  The glue will flake off over the next 2-3 weeks. °10. Any sutures or staples will be removed at the office during your follow-up visit. ° °ACTIVITIES °11. You may resume regular (light) daily activities beginning the next day--such as daily self-care, walking, climbing stairs--gradually increasing activities as tolerated.  You may have sexual intercourse when it is comfortable.  Refrain from any heavy lifting or straining until approved by your doctor. °a. You may drive when you are no longer taking prescription pain medication, you can comfortably wear a seatbelt, and you can safely maneuver your car and apply brakes. ° °FOLLOW-UP °12. You should see your doctor in the office for a follow-up appointment approximately 2-3 weeks after your surgery.  You should have been given your post-op/follow-up appointment when your surgery was scheduled.  If you did not receive a post-op/follow-up appointment, make sure that you call for this appointment within a day or two after you arrive home to insure a convenient appointment time. ° °  OTHER INSTRUCTIONS ° °WHEN TO CALL YOUR DOCTOR: °1. Fever over 101.0 °2. Inability to urinate °3. Continued bleeding from incision. °4. Increased pain, redness, or drainage from the incision. °5. Increasing abdominal pain ° °The clinic staff is available to answer your questions during regular business  hours.  Please don’t hesitate to call and ask to speak to one of the nurses for clinical concerns.  If you have a medical emergency, go to the nearest emergency room or call 911.  A surgeon from [facility] is always on call at the hospital. °[address], [address], [place], [place]  [address] ? [address] [address], , [place]   [address] °[phone] ? [phone] ? FAX [phone] ° ° ° °

## 2018-05-31 NOTE — Progress Notes (Signed)
Report received from Lindsi, RN -6N

## 2018-05-31 NOTE — ED Notes (Signed)
Report given to [facility], RN with [facility], and to the floor. Pt going to [facility] [id number].

## 2018-05-31 NOTE — Discharge Summary (Signed)
[facility] Discharge Summary   Patient ID: Christina Greer MRN: [id number] DOB/AGE: Oct 23, 1980 38 y.o.  Admit date: 05/30/2018 Discharge date: 05/31/2018  Admitting Diagnosis: Appendicitis   Discharge Diagnosis Patient Active Problem List   Diagnosis Date Noted  . Acute appendicitis 05/30/2018    Consultants none  Imaging: Ct Abdomen Pelvis W Contrast  Result Date: 05/30/2018 CLINICAL DATA:  Right lower quadrant pain for several hours EXAM: CT ABDOMEN AND PELVIS WITH CONTRAST TECHNIQUE: Multidetector CT imaging of the abdomen and pelvis was performed using the standard protocol following bolus administration of intravenous contrast. CONTRAST:  100mL OMNIPAQUE 300 COMPARISON:  None. FINDINGS: Lower chest: No acute abnormality. Hepatobiliary: No focal liver abnormality is seen. Status post cholecystectomy. No biliary dilatation. Pancreas: Unremarkable. No pancreatic ductal dilatation or surrounding inflammatory changes. Spleen: Normal in size without focal abnormality. Adrenals/Urinary Tract: Adrenal glands are within normal limits. Kidneys are well visualized bilaterally without renal calculi or obstructive changes. The bladder is within normal limits. Stomach/Bowel: Stomach is within normal limits. No small bowel abnormality is noted. The colon is unremarkable. Appendix: Location: Infracecal Diameter: 13 mm Appendicolith: Present centrally. Multiple smaller appendicoliths are noted peripherally. Mucosal hyper-enhancement: Present Extraluminal gas: Absent Periappendiceal collection: Absent Vascular/Lymphatic: No significant vascular findings are present. No enlarged abdominal or pelvic lymph nodes. Reproductive: Uterus is within normal limits. Left ovarian cyst is noted measuring 2 cm in greatest dimension. Other: Minimal free pelvic fluid is noted likely reactive in nature. Musculoskeletal: No acute or significant osseous findings. IMPRESSION: Changes consistent with very  early appendicitis as described above. No other focal abnormality is seen. Electronically Signed   By: Alcide CleverMark  Lukens M.D.   On: 05/30/2018 23:15    Procedures Dr. Antonieta Pertameriz (05/31/18) - Laparoscopic Appendectomy  Hospital Course:  Pt is a [age] yof who [facility].  Workup showed appendicitis.  Patient was admitted and underwent procedure listed above.  Tolerated procedure well and was transferred to the floor.  Diet was advanced as tolerated.  On POD#0, the patient was voiding well, tolerating diet, ambulating well, pain well controlled, vital signs stable, incisions c/d/i and felt stable for discharge home.  Patient will follow up as outlined below and knows to call with questions or concerns.     Patient was discharged in good condition.  [place]  Sub[place] reviewed prior to prescribing narcotic pain medication to this patient.  Physical Exam: General:  Alert, NAD, pleasant, cooperative, sitting up in chair Resp: rate and effort normal Abd:  Soft, ND, incisions will glue intact appear well without bleeding or drainage  Skin: warm and dry  Allergies as of 05/31/2018      Reactions   Peanut-containing Drug Products Anaphylaxis      Medication List    TAKE these medications   buPROPion 150 MG 24 hr tablet Commonly known as:  WELLBUTRIN XL Take 150 mg by mouth 2 (two) times daily.   cholecalciferol 25 MCG (1000 UT) tablet Commonly known as:  VITAMIN D3 Take 1,000 Units by mouth daily.   multivitamin with minerals tablet Take 1 tablet by mouth daily.   oxyCODONE 5 MG immediate release tablet Commonly known as:  Oxy IR/ROXICODONE Take 1 tablet (5 mg total) by mouth every 6 (six) hours as needed for moderate pain.   topiramate 50 MG tablet Commonly known as:  TOPAMAX Take 50 mg by mouth 2 (two) times daily.        Follow-up Information    [facility] Call.   Specialty:  General Surgery Why:  We are  working on your appt, call to confirm. Due to coronavirus we are decreasing foot traffic in office. Instead of coming to an appt a provider will call you at the above date/time. Send picture of your incision to [email or web address]. Contact information: [address] [place] [address] [phone]          Signed: [facility] 05/31/2018, 3:49 PM Pager: [phone] Consults: [phone] Mon-Fri 7:00 am-4:30 pm Sat-Sun 7:00 am-11:30 am

## 2018-05-31 NOTE — Progress Notes (Signed)
Patient discharged to home. Verbalizes understanding of all discharge instructions including incision care, discharge medications, and follow up MD visits.  

## 2018-05-31 NOTE — Anesthesia Procedure Notes (Signed)
Procedure Name: Intubation Date/Time: 05/31/2018 9:54 AM Performed by: Trinna Post., CRNA Pre-anesthesia Checklist: Patient identified, Emergency Drugs available, Suction available and Patient being monitored Patient Re-evaluated:Patient Re-evaluated prior to induction Oxygen Delivery Method: Circle system utilized Preoxygenation: Pre-oxygenation with 100% oxygen Induction Type: IV induction, Rapid sequence and Cricoid Pressure applied Laryngoscope Size: Mac and 3 Grade View: Grade I Tube type: Oral Tube size: 7.0 mm Number of attempts: 1 Airway Equipment and Method: Stylet Placement Confirmation: ETT inserted through vocal cords under direct vision,  positive ETCO2 and breath sounds checked- equal and bilateral Secured at: 22 cm Tube secured with: Tape Dental Injury: Teeth and Oropharynx as per pre-operative assessment

## 2018-05-31 NOTE — ED Notes (Signed)
Pt given small snack of cheese and crackers and ginger ale

## 2018-05-31 NOTE — Transfer of Care (Signed)
Immediate Anesthesia Transfer of Care Note  Patient: Christina Greer  Procedure(s) Performed: APPENDECTOMY LAPAROSCOPIC (N/A Abdomen)  Patient Location: PACU  Anesthesia Type:General  Level of Consciousness: awake, alert  and oriented  Airway & Oxygen Therapy: Patient Spontanous Breathing and Patient connected to face mask oxygen  Post-op Assessment: Report given to RN and Post -op Vital signs reviewed and stable  Post vital signs: Reviewed and stable  Last Vitals:  Vitals Value Taken Time  BP 109/76 05/31/2018 10:59 AM  Temp    Pulse 93 05/31/2018 10:59 AM  Resp 28 05/31/2018 10:59 AM  SpO2 100 % 05/31/2018 10:59 AM  Vitals shown include unvalidated device data.  Last Pain:  Vitals:   05/31/18 0925  TempSrc:   PainSc: 4          Complications: No apparent anesthesia complications

## 2018-05-31 NOTE — Anesthesia Postprocedure Evaluation (Signed)
Anesthesia Post Note  Patient: Christina Greer  Procedure(s) Performed: APPENDECTOMY LAPAROSCOPIC (N/A Abdomen)     Patient location during evaluation: PACU Anesthesia Type: General Level of consciousness: awake and alert Pain management: pain level controlled Vital Signs Assessment: post-procedure vital signs reviewed and stable Respiratory status: spontaneous breathing, nonlabored ventilation and respiratory function stable Cardiovascular status: blood pressure returned to baseline and stable Postop Assessment: no apparent nausea or vomiting Anesthetic complications: no    Last Vitals:  Vitals:   05/31/18 1115 05/31/18 1130  BP: 104/77 109/73  Pulse: 92 93  Resp: 16 20  Temp:    SpO2: 98% 99%    Last Pain:  Vitals:   05/31/18 1145  TempSrc:   PainSc: 7                  Henreitta Spittler,W. EDMOND

## 2018-06-01 ENCOUNTER — Encounter (HOSPITAL_COMMUNITY): Payer: Self-pay | Admitting: General Surgery

## 2019-11-25 IMAGING — CT CT ABDOMEN AND PELVIS WITH CONTRAST
2 of 4 series · 16 of 46 positions shown, 18 images · IV contrast (APPLIED)
Comparison: None.

CLINICAL DATA: Right lower quadrant pain for several hours

EXAM:
CT ABDOMEN AND PELVIS WITH CONTRAST
TECHNIQUE: Multidetector CT imaging of the abdomen and pelvis was performed
using the standard protocol following bolus administration of
intravenous contrast.
CONTRAST:  100mL OMNIPAQUE 300

[Series 2: axial st · axial · 0.95mm/px · z∈[-581,-61]mm · 13 of 114 slices shown, 15 images]
[im 5/114  soft-tissue]
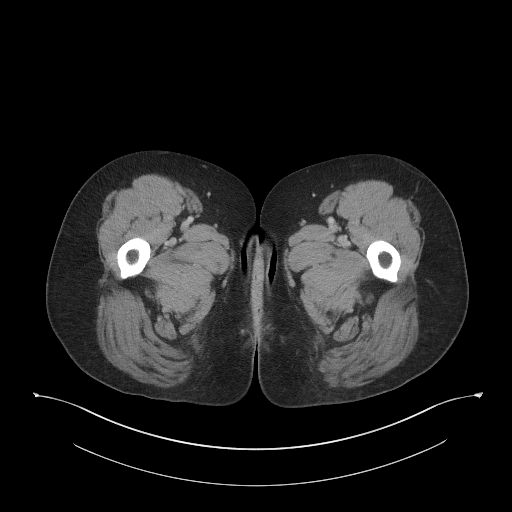
[im 5/114  bone]
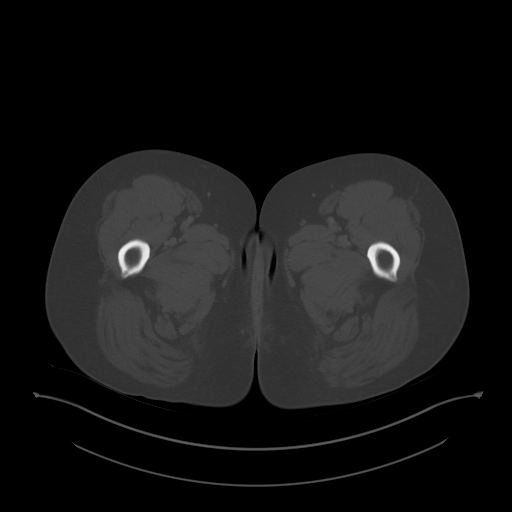
[im 15/114  soft-tissue]
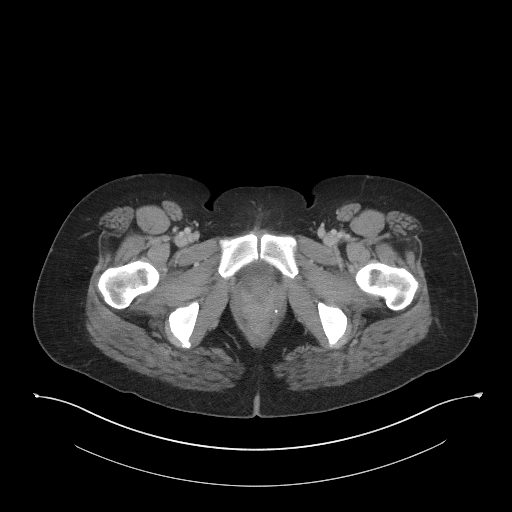
[im 24/114  soft-tissue]
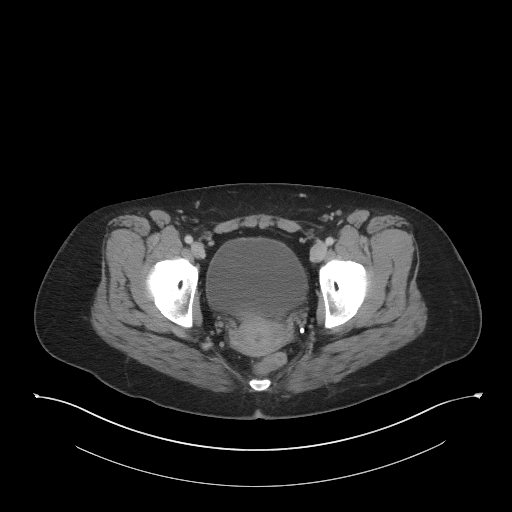
[im 33/114  soft-tissue]
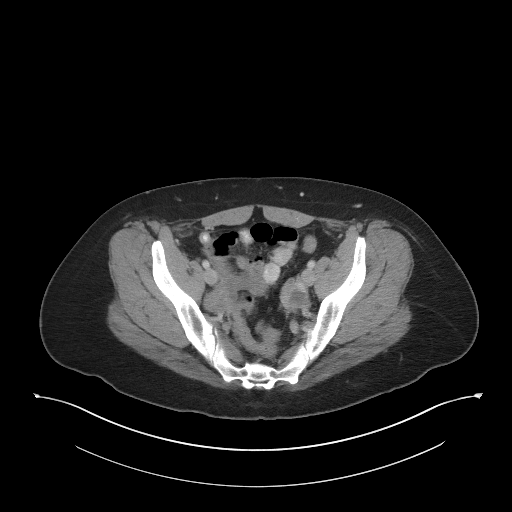
[im 38/114  soft-tissue]
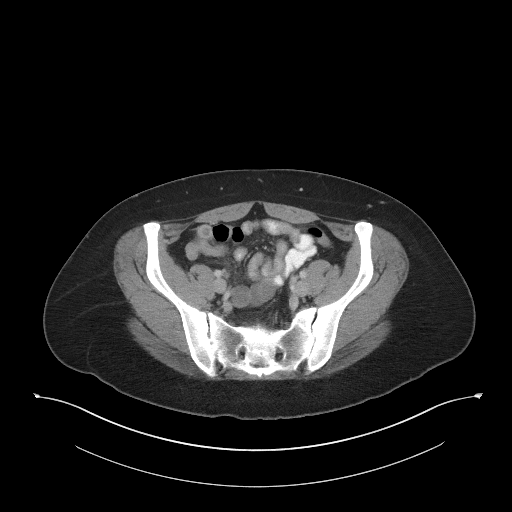
[im 48/114  soft-tissue]
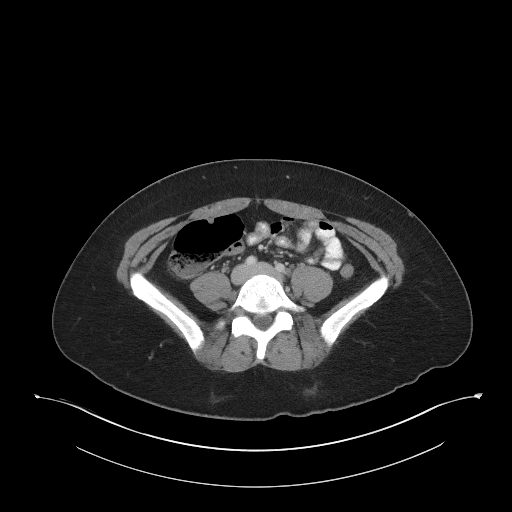
[im 57/114  soft-tissue]
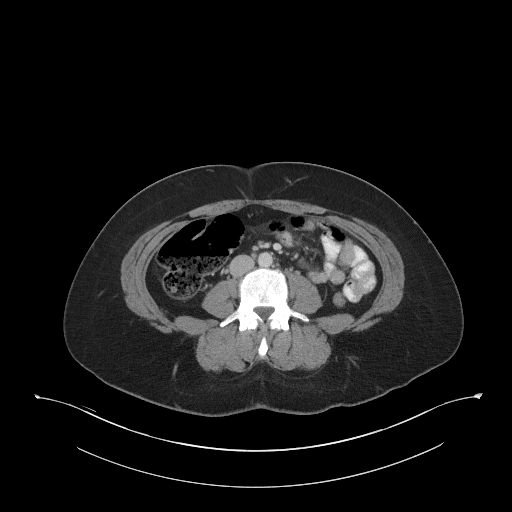
[im 66/114  soft-tissue]
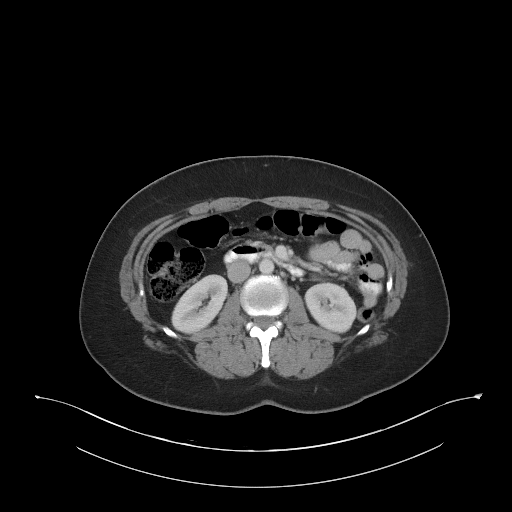
[im 76/114  soft-tissue]
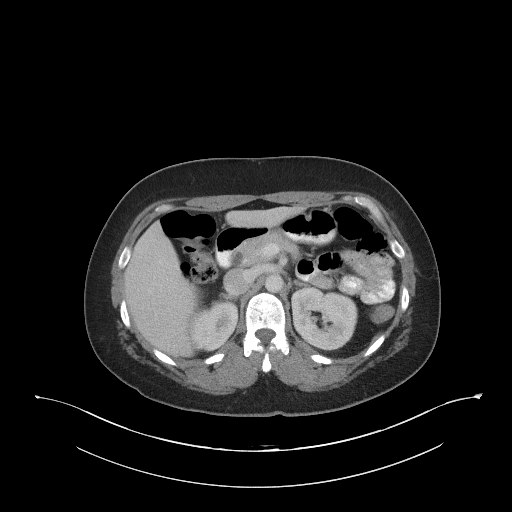
[im 76/114  bone]
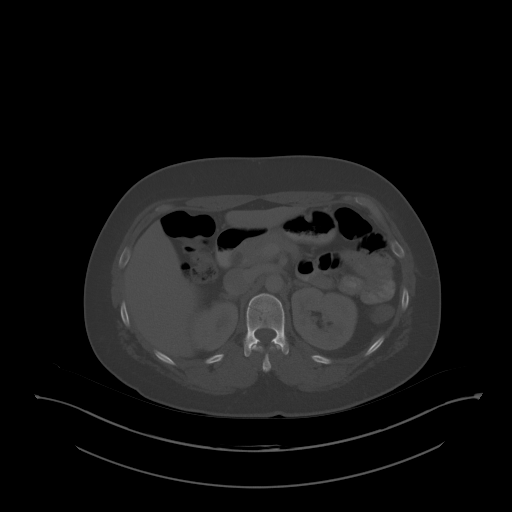
[im 81/114  soft-tissue]
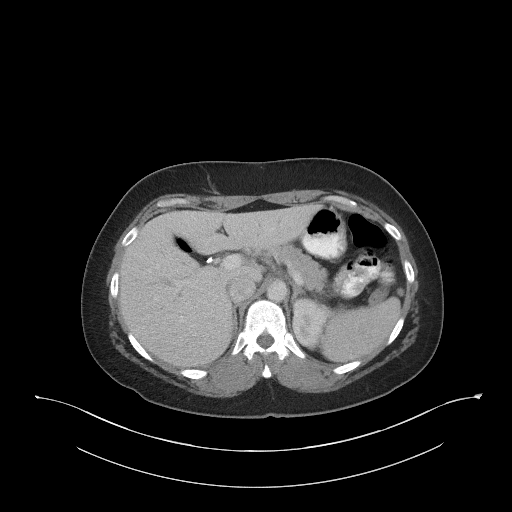
[im 90/114  soft-tissue]
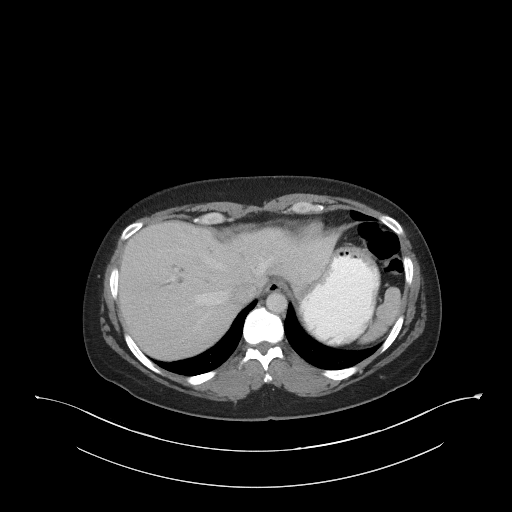
[im 99/114  soft-tissue]
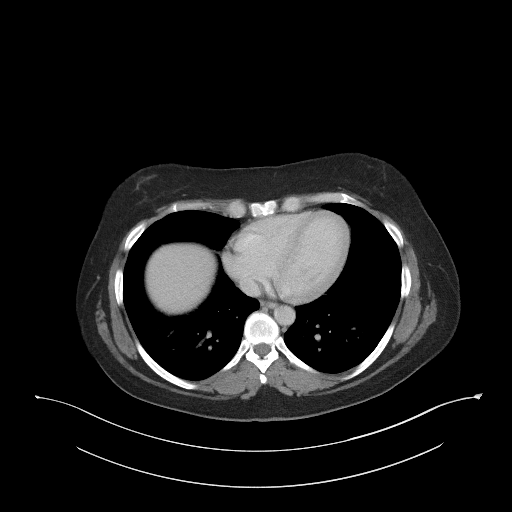
[im 109/114  soft-tissue]
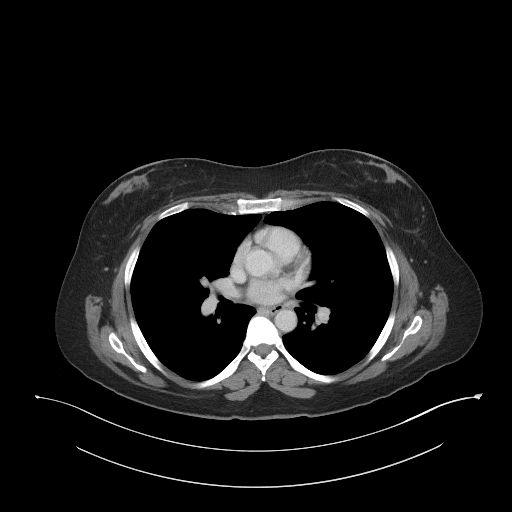

[Series 5: coronal st · coronal · 0.87mm/px · 3 of 98 slices shown]
[im 33/98  soft-tissue]
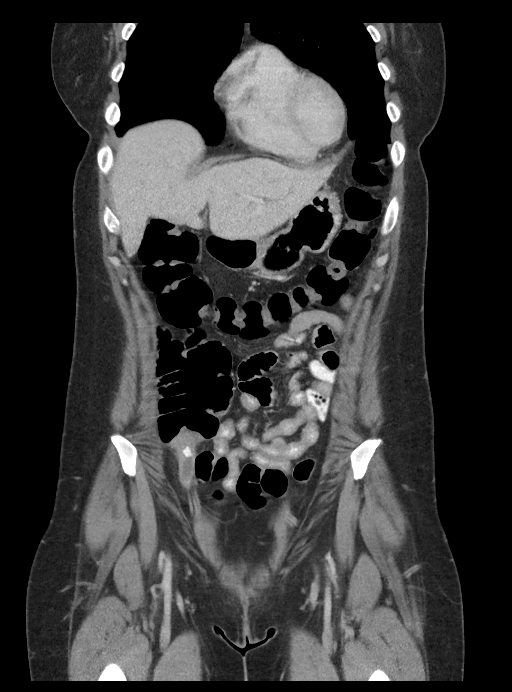
[im 44/98  soft-tissue]
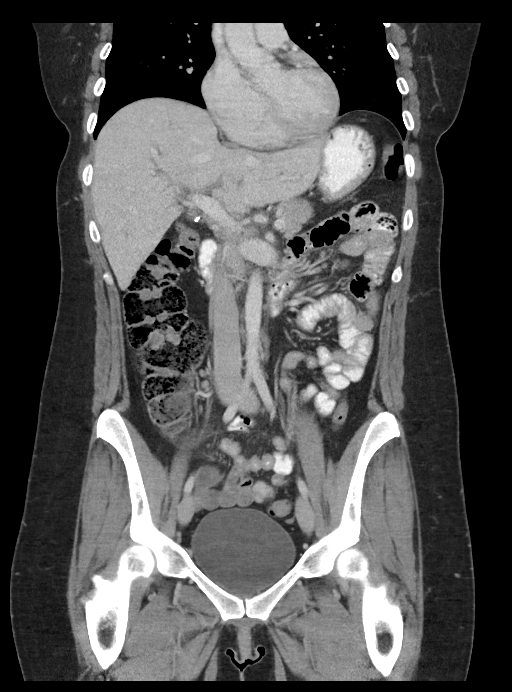
[im 54/98  soft-tissue]
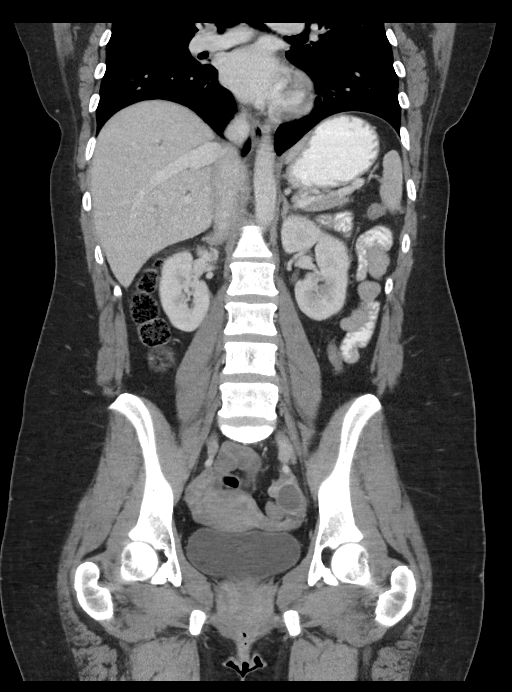

[16 of 46 positions shown; findings below may reference images not displayed]

FINDINGS: Lower chest: No acute abnormality.

Hepatobiliary: No focal liver abnormality is seen. Status post
cholecystectomy. No biliary dilatation.

Pancreas: Unremarkable. No pancreatic ductal dilatation or
surrounding inflammatory changes.

Spleen: Normal in size without focal abnormality.

Adrenals/Urinary Tract: Adrenal glands are within normal limits.
Kidneys are well visualized bilaterally without renal calculi or
obstructive changes. The bladder is within normal limits.

Stomach/Bowel: Stomach is within normal limits. No small bowel
abnormality is noted. The colon is unremarkable.

Appendix: Location: Infracecal

Diameter: 13 mm

Appendicolith: Present centrally. Multiple smaller appendicoliths
are noted peripherally.

Mucosal hyper-enhancement: Present

Extraluminal gas: Absent

Periappendiceal collection: Absent

Vascular/Lymphatic: No significant vascular findings are present. No
enlarged abdominal or pelvic lymph nodes.

Reproductive: Uterus is within normal limits. Left ovarian cyst is
noted measuring 2 cm in greatest dimension.

Other: Minimal free pelvic fluid is noted likely reactive in nature.

Musculoskeletal: No acute or significant osseous findings.
IMPRESSION: Changes consistent with very early appendicitis as described above.

No other focal abnormality is seen.
